# Patient Record
Sex: Female | Born: 1995 | Race: White | Hispanic: No
Health system: Southern US, Community
[De-identification: ages and names within clinical notes are randomized; demographics above are authoritative.]

---
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
## 2020-12-02 ENCOUNTER — Other Ambulatory Visit: Payer: Self-pay

## 2020-12-02 DIAGNOSIS — Z20822 Contact with and (suspected) exposure to covid-19: Secondary | ICD-10-CM

## 2020-12-05 LAB — NOVEL CORONAVIRUS, NAA: SARS-CoV-2, NAA: NOT DETECTED

## 2020-12-06 ENCOUNTER — Telehealth: Payer: Self-pay

## 2020-12-06 NOTE — Telephone Encounter (Signed)
Negative COVID results given. Patient results "NOT Detected." Caller expressed understanding. ° °
